# Patient Record
Sex: Male | Born: 1985 | Race: Black or African American | Hispanic: No | Marital: Single
Health system: Southern US, Community
[De-identification: ages and names within clinical notes are randomized; demographics above are authoritative.]

## PROBLEM LIST (undated history)

## (undated) ENCOUNTER — Ambulatory Visit: Payer: Self-pay | Source: Home / Self Care

## (undated) DIAGNOSIS — C9590 Leukemia, unspecified not having achieved remission: Secondary | ICD-10-CM

## (undated) HISTORY — DX: Leukemia, unspecified not having achieved remission: C95.90

---

## 2004-10-06 ENCOUNTER — Emergency Department (HOSPITAL_COMMUNITY): Admission: EM | Admit: 2004-10-06 | Discharge: 2004-10-06 | Payer: Self-pay | Admitting: Family Medicine

## 2005-06-24 ENCOUNTER — Emergency Department (HOSPITAL_COMMUNITY): Admission: EM | Admit: 2005-06-24 | Discharge: 2005-06-24 | Payer: Self-pay | Admitting: *Deleted
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
## 2006-06-01 ENCOUNTER — Emergency Department (HOSPITAL_COMMUNITY): Admission: EM | Admit: 2006-06-01 | Discharge: 2006-06-01 | Payer: Self-pay | Admitting: Emergency Medicine

## 2018-10-10 ENCOUNTER — Ambulatory Visit: Payer: Self-pay

## 2018-10-10 NOTE — Telephone Encounter (Signed)
Patient called in with c/o "fever." He says "On Sunday I had a sore throat. I was working the weekend and several people were sick and I think I caught what they had. I haven\'t been to work all week. I have been feeling worn out, tired, no appetite. Today my throat is not sore. My girlfriend took my temperature about 20 minutes ago and it was 103. I\'ve been taking Ibuprofen and allergy medicine for my fever." I asked what was the fever earlier in the week and yesterday, he says "I haven\'t checked it, but I felt like I had one." I asked about other symptoms, he denies. According to protocol, see PCP within 24 hours, new patient appointment scheduled with Dr. Hernandez for Tuesday, 11/07/18. No availability with Dr. Hernandez for the next 24 hours, appointment scheduled for tomorrow at 1600 with Cory Nafziger, NP, care advice given, patient verbalized understanding.   Reason for Disposition . Fever present > 3 days (72 hours)  Answer Assessment - Initial Assessment Questions 1. TEMPERATURE: "What is the most recent temperature?"  "How was it measured?"      10 3 2. ONSET: "When did the fever start?"      Today 3. SYMPTOMS: "Do you have any other symptoms besides the fever?"  (e.g., colds, headache, sore throat, earache, cough, rash, diarrhea, vomiting, abdominal pain)     Sore throat since Sunday afternoon, but not now; feeling tired, worn out 4. CAUSE: If there are no symptoms, ask: "What do you think is causing the fever?"      I don't know 5. CONTACTS: "Does anyone else in the family have an infection?"     People at work sick over the weekend, which I worked on Sunday 6. TREATMENT: "What have you done so far to treat this fever?" (e.g., medications)     Ibuprofen, allergy medication 7. IMMUNOCOMPROMISE: "Do you have of the following: diabetes, HIV positive, splenectomy, cancer chemotherapy, chronic steroid treatment, transplant patient, etc."     No 8. PREGNANCY: "Is there any chance you are  pregnant?" "When was your last menstrual period?"     N/A 9. TRAVEL: "Have you traveled out of the country in the last month?" (e.g., travel history, exposures)     No  Protocols used: FEVER-A-AH

## 2018-10-11 ENCOUNTER — Ambulatory Visit (INDEPENDENT_AMBULATORY_CARE_PROVIDER_SITE_OTHER): Payer: Managed Care, Other (non HMO) | Admitting: Adult Health

## 2018-10-11 ENCOUNTER — Ambulatory Visit (INDEPENDENT_AMBULATORY_CARE_PROVIDER_SITE_OTHER): Payer: Managed Care, Other (non HMO)

## 2018-10-11 ENCOUNTER — Encounter: Payer: Self-pay | Admitting: Adult Health

## 2018-10-11 ENCOUNTER — Encounter: Payer: Self-pay | Admitting: Family Medicine

## 2018-10-11 VITALS — BP 110/60 | Temp 102.7°F | Wt 170.0 lb

## 2018-10-11 DIAGNOSIS — J0140 Acute pansinusitis, unspecified: Secondary | ICD-10-CM

## 2018-10-11 LAB — POCT INFLUENZA A/B
INFLUENZA B, POC: NEGATIVE
Influenza A, POC: NEGATIVE

## 2018-10-11 LAB — POCT RAPID STREP A (OFFICE): Rapid Strep A Screen: NEGATIVE

## 2018-10-11 MED ORDER — DOXYCYCLINE HYCLATE 100 MG PO CAPS: 100.0000 mg | ORAL_CAPSULE | Freq: Two times a day (BID) | ORAL | 0 refills | Status: AC

## 2018-10-11 MED FILL — DOXYCYCLINE HYCLATE 100 MG: 100 | 7 days supply | Qty: 14 | Fill #0

## 2018-10-11 NOTE — Progress Notes (Signed)
Subjective:    Patient ID: Christian Reynolds, male    DOB: [birth year], 32 y.o.   MRN: [id number]   32 year old male who  has a past medical history of Leukemia ([place]).  He presents to the office today for an acute issue.Marland Kitchen He reports that his symptoms started three days ago. His symptoms include that of fever ( up to 103), sore throat, fatigue, and nasal congestion. Sore throat has resolved.   He has been treating with Theraflu and motrin which helps lower his fever to around 99.   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  HENT: Positive for congestion and sore throat (resolved). Negative for postnasal drip, sinus pressure, sinus pain and trouble swallowing.   Respiratory: Negative.   Cardiovascular: Negative.   Genitourinary: Negative.   Neurological: Negative.   Hematological: Negative.    Past Medical History:  Diagnosis Date  . Leukemia ([place])    at age 56     Social History   Socioeconomic History  . Marital status: Single    Spouse name: Not on file  . Number of children: Not on file  . Years of education: Not on file  . Highest education level: Not on file  Occupational History  . Not on file  Social Needs  . Financial resource strain: Not on file  . Food insecurity:    Worry: Not on file    Inability: Not on file  . Transportation needs:    Medical: Not on file    Non-medical: Not on file  Tobacco Use  . Smoking status: Not on file  Substance and Sexual Activity  . Alcohol use: Not on file  . Drug use: Not on file  . Sexual activity: Not on file  Lifestyle  . Physical activity:    Days per week: Not on file    Minutes per session: Not on file  . Stress: Not on file  Relationships  . Social connections:    Talks on phone: Not on file    Gets together: Not on file    Attends religious service: Not on file   Active member of club or organization: Not on file    Attends meetings of clubs or organizations: Not on file    Relationship status: Not on file  . Intimate partner violence:    Fear of current or ex partner: Not on file    Emotionally abused: Not on file    Physically abused: Not on file    Forced sexual activity: Not on file  Other Topics Concern  . Not on file  Social History Narrative  . Not on file    No family history on file.  No Known Allergies  No current outpatient medications on file prior to visit.   No current facility-administered medications on file prior to visit.     Temp (!) 102.7 F (39.3 C)   Wt 170 lb (77.1 kg)       Objective:   Physical Exam  Constitutional: He is oriented to person, place, and time. He appears well-developed and well-nourished. No distress.  HENT:  Head: Normocephalic and atraumatic.  Right Ear: Hearing, tympanic membrane, external ear and ear canal normal.  Left Ear: Hearing, tympanic membrane, external ear and ear canal normal.  Nose: Mucosal edema, rhinorrhea and septal deviation present. Right sinus exhibits  no maxillary sinus tenderness and no frontal sinus tenderness. Left sinus exhibits no maxillary sinus tenderness and no frontal sinus tenderness.  Mouth/Throat: Uvula is midline, oropharynx is clear and moist and mucous membranes are normal. No oropharyngeal exudate.  Cardiovascular: Normal rate, regular rhythm, normal heart sounds and intact distal pulses.  Pulmonary/Chest: Effort normal and breath sounds normal.  Neurological: He is alert and oriented to person, place, and time.  Skin: Skin is warm. Capillary refill takes less than 2 seconds. He is diaphoretic.  Psychiatric: He has a normal mood and affect. His behavior is normal. Judgment and thought content normal.  Nursing note and vitals reviewed.      Assessment & Plan:  1. Acute non-recurrent pansinusitis - Appears as sinusitis. Will check labs and chest xray  due to fever and PMH - doxycycline (VIBRAMYCIN) 100 MG capsule; Take 1 capsule (100 mg total) by mouth 2 (two) times daily.  Dispense: 14 capsule; Refill: 0 - CBC with Differential/Platelet - Basic Metabolic Panel - DG Chest 2 View; Future - POC Influenza A/B- negative  - POC Rapid Strep A- negative   Dorothyann Peng, NP

## 2018-10-12 ENCOUNTER — Telehealth: Payer: Self-pay | Admitting: Adult Health

## 2018-10-12 LAB — BASIC METABOLIC PANEL
BUN: 17 mg/dL (ref 6–23)
CALCIUM: 9.1 mg/dL (ref 8.4–10.5)
CHLORIDE: 98 meq/L (ref 96–112)
CO2: 28 mEq/L (ref 19–32)
Creatinine, Ser: 1.2 mg/dL (ref 0.40–1.50)
GFR: 89.27 mL/min (ref >60.00)
Glucose, Bld: 125 mg/dL — ABNORMAL HIGH (ref 70–99)
POTASSIUM: 3.6 meq/L (ref 3.5–5.1)
SODIUM: 135 meq/L (ref 135–145)

## 2018-10-12 LAB — CBC WITH DIFFERENTIAL/PLATELET
BASOS ABS: 0 10*3/uL (ref 0.0–0.1)
BASOS PCT: 0.4 % (ref 0.0–3.0)
Eosinophils Absolute: 0 10*3/uL (ref 0.0–0.7)
Eosinophils Relative: 0.1 % (ref 0.0–5.0)
HCT: 45.7 % (ref 39.0–52.0)
HEMOGLOBIN: 15.6 g/dL (ref 13.0–17.0)
LYMPHS ABS: 1 10*3/uL (ref 0.7–4.0)
Lymphocytes Relative: 10.4 % — ABNORMAL LOW (ref 12.0–46.0)
MCHC: 34.2 g/dL (ref 30.0–36.0)
MCV: 81.5 fl (ref 78.0–100.0)
MONO ABS: 0.7 10*3/uL (ref 0.1–1.0)
Monocytes Relative: 7.3 % (ref 3.0–12.0)
NEUTROS ABS: 7.6 10*3/uL (ref 1.4–7.7)
NEUTROS PCT: 81.8 % — AB (ref 43.0–77.0)
Platelets: 173 10*3/uL (ref 150.0–400.0)
RBC: 5.61 Mil/uL (ref 4.22–5.81)
RDW: 13.3 % (ref 11.5–15.5)
WBC: 9.2 10*3/uL (ref 4.0–10.5)

## 2018-10-12 NOTE — Telephone Encounter (Signed)
Entered in error

## 2018-10-19 ENCOUNTER — Encounter: Payer: Self-pay | Admitting: Family Medicine

## 2018-11-07 ENCOUNTER — Ambulatory Visit: Payer: Self-pay | Admitting: Internal Medicine

## 2018-11-07 DIAGNOSIS — Z0289 Encounter for other administrative examinations: Secondary | ICD-10-CM

## 2019-06-19 IMAGING — DX DG CHEST 2V
2 series · 2 of 2 positions shown · non-contrast
Comparison: None.

CLINICAL DATA: Fever of unknown origin.  Cough and congestion.

EXAM:
CHEST - 2 VIEW

[chest pa]
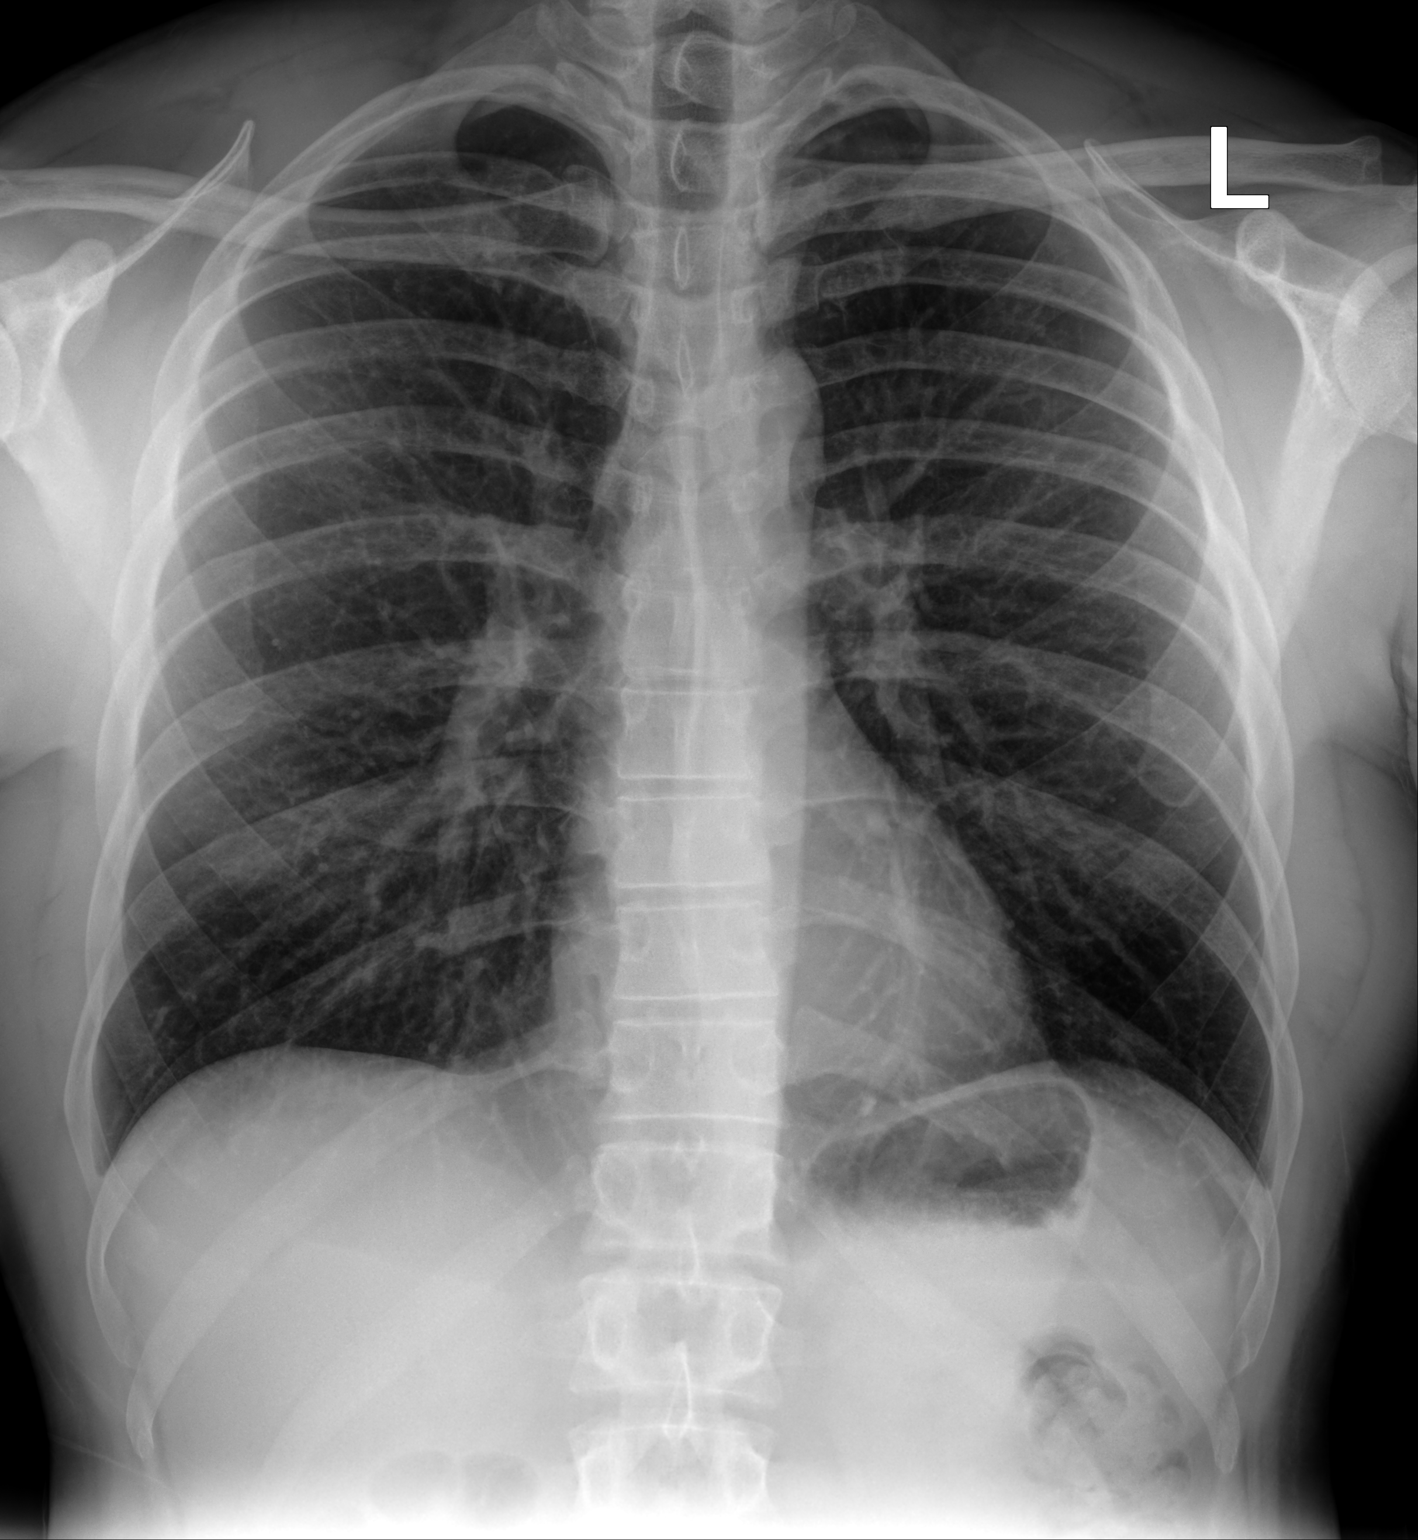

[chest lat]
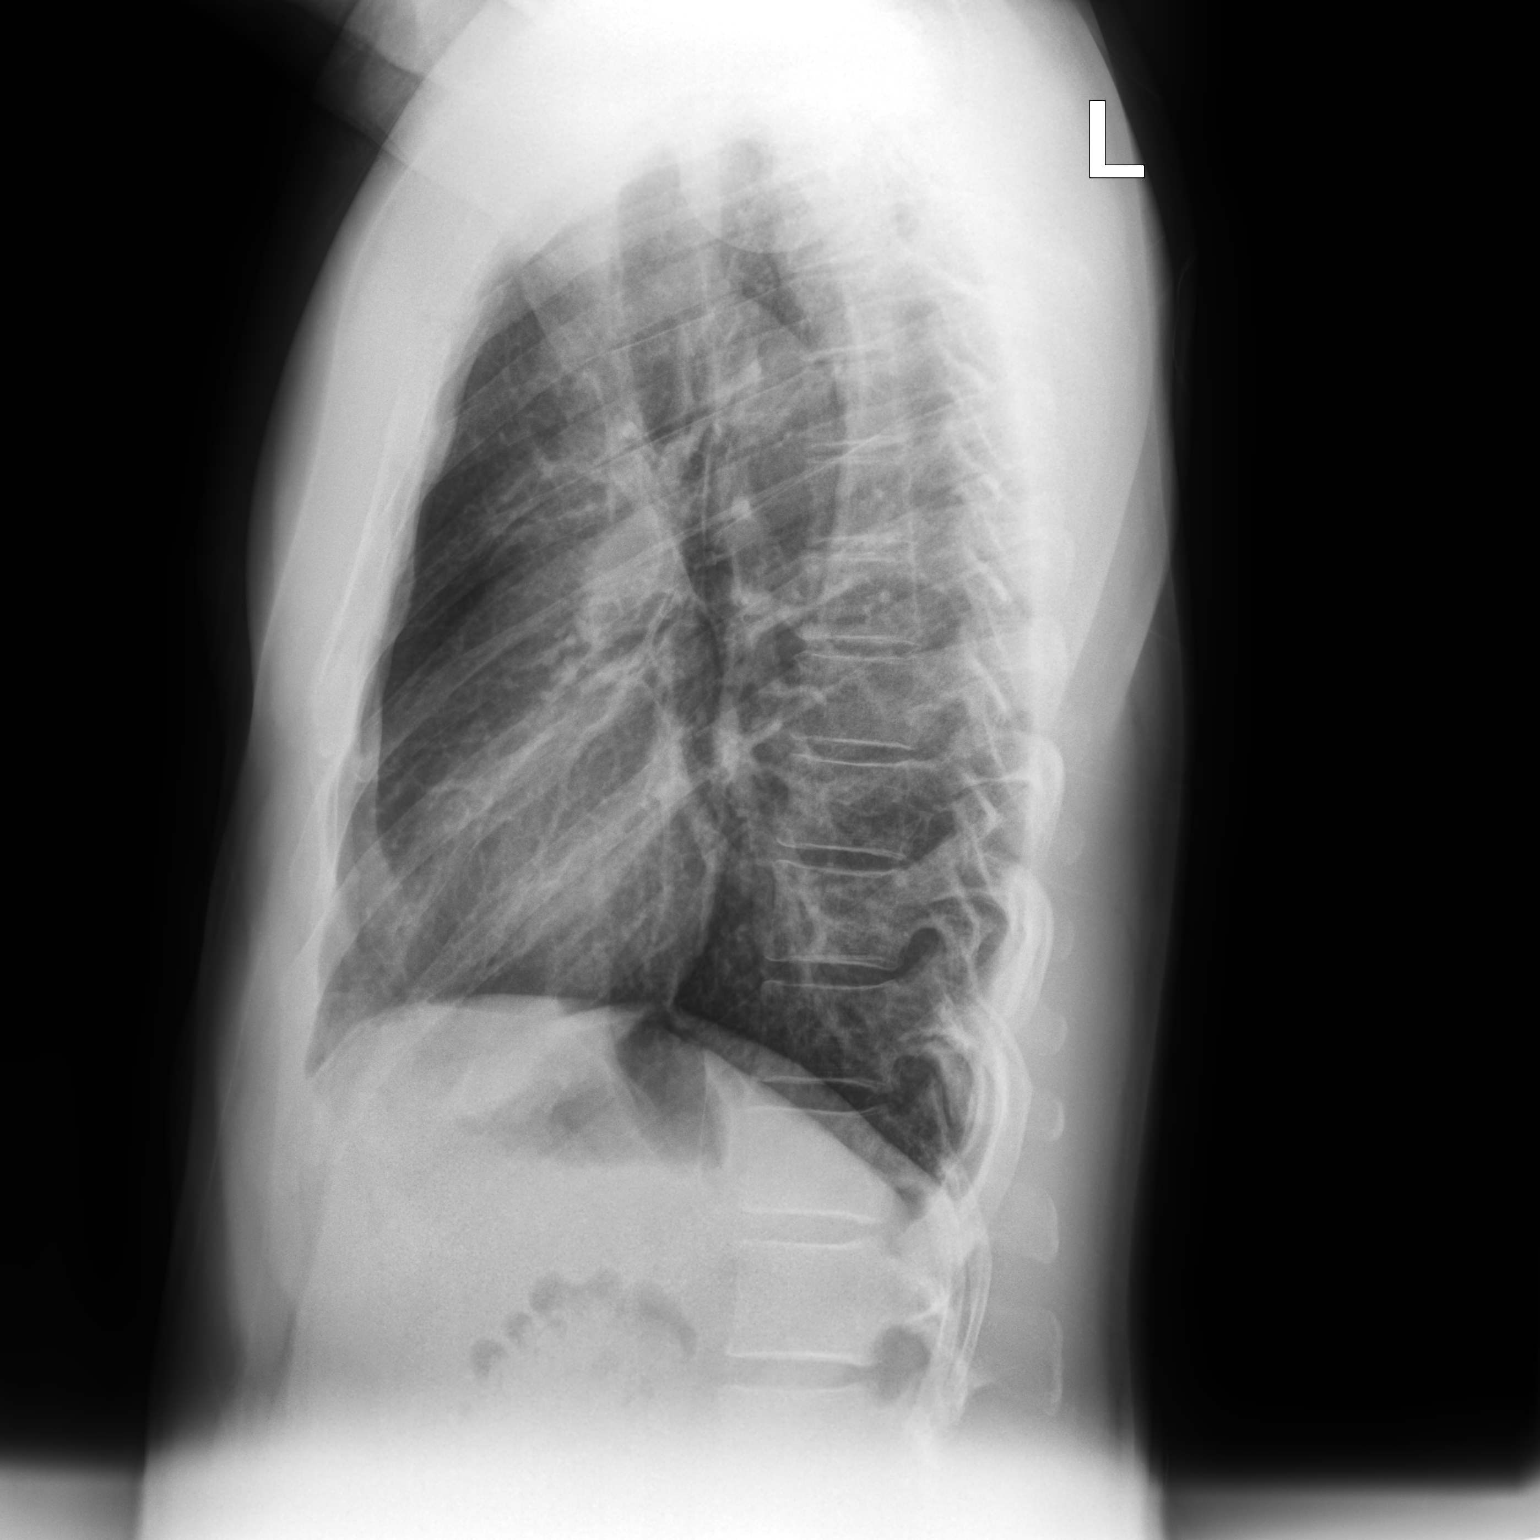

[2 of 2 positions shown; findings below may reference images not displayed]

FINDINGS: The heart size and mediastinal contours are within normal limits.
Both lungs are clear. The visualized skeletal structures are
unremarkable.
IMPRESSION: No active cardiopulmonary disease.

## 2021-02-22 ENCOUNTER — Ambulatory Visit (HOSPITAL_COMMUNITY): Payer: Self-pay
